# Patient Record
Sex: Female | Born: 1970 | Race: White | Hispanic: No | Marital: Married | Smoking: Former smoker
Health system: Southern US, Community
[De-identification: ages and names within clinical notes are randomized; demographics above are authoritative.]

## PROBLEM LIST (undated history)

## (undated) DIAGNOSIS — I8393 Asymptomatic varicose veins of bilateral lower extremities: Secondary | ICD-10-CM

## (undated) HISTORY — DX: Asymptomatic varicose veins of bilateral lower extremities: I83.93

## (undated) HISTORY — PX: APPENDECTOMY: SHX54

---

## 1998-02-28 ENCOUNTER — Other Ambulatory Visit: Admission: RE | Admit: 1998-02-28 | Discharge: 1998-02-28 | Payer: Self-pay | Admitting: Obstetrics and Gynecology

## 1999-05-13 ENCOUNTER — Emergency Department (HOSPITAL_COMMUNITY): Admission: EM | Admit: 1999-05-13 | Discharge: 1999-05-13 | Payer: Self-pay | Admitting: Emergency Medicine

## 1999-09-05 ENCOUNTER — Inpatient Hospital Stay (HOSPITAL_COMMUNITY): Admission: AD | Admit: 1999-09-05 | Discharge: 1999-09-07 | Payer: Self-pay | Admitting: Obstetrics and Gynecology

## 1999-10-16 ENCOUNTER — Other Ambulatory Visit: Admission: RE | Admit: 1999-10-16 | Discharge: 1999-10-16 | Payer: Self-pay | Admitting: Obstetrics and Gynecology

## 1999-11-09 ENCOUNTER — Other Ambulatory Visit: Admission: RE | Admit: 1999-11-09 | Discharge: 1999-11-09 | Payer: Self-pay | Admitting: Pediatrics

## 2000-01-22 ENCOUNTER — Other Ambulatory Visit: Admission: RE | Admit: 2000-01-22 | Discharge: 2000-01-22 | Payer: Self-pay | Admitting: Physical Therapy

## 2000-06-13 ENCOUNTER — Other Ambulatory Visit: Admission: RE | Admit: 2000-06-13 | Discharge: 2000-06-13 | Payer: Self-pay | Admitting: Obstetrics and Gynecology

## 2000-10-07 ENCOUNTER — Other Ambulatory Visit: Admission: RE | Admit: 2000-10-07 | Discharge: 2000-10-07 | Payer: Self-pay | Admitting: Obstetrics and Gynecology

## 2001-02-13 ENCOUNTER — Other Ambulatory Visit: Admission: RE | Admit: 2001-02-13 | Discharge: 2001-02-13 | Payer: Self-pay | Admitting: Obstetrics and Gynecology

## 2002-01-30 ENCOUNTER — Other Ambulatory Visit: Admission: RE | Admit: 2002-01-30 | Discharge: 2002-01-30 | Payer: Self-pay | Admitting: Obstetrics and Gynecology

## 2003-02-26 ENCOUNTER — Other Ambulatory Visit: Admission: RE | Admit: 2003-02-26 | Discharge: 2003-02-26 | Payer: Self-pay | Admitting: Obstetrics and Gynecology

## 2004-03-29 ENCOUNTER — Other Ambulatory Visit: Admission: RE | Admit: 2004-03-29 | Discharge: 2004-03-29 | Payer: Self-pay | Admitting: Obstetrics and Gynecology

## 2012-07-22 ENCOUNTER — Other Ambulatory Visit (HOSPITAL_COMMUNITY): Payer: Self-pay | Admitting: Obstetrics & Gynecology

## 2012-07-22 DIAGNOSIS — Z1231 Encounter for screening mammogram for malignant neoplasm of breast: Secondary | ICD-10-CM

## 2012-08-07 ENCOUNTER — Ambulatory Visit (HOSPITAL_COMMUNITY)
Admission: RE | Admit: 2012-08-07 | Discharge: 2012-08-07 | Disposition: A | Discharge status: Home / Self Care | Payer: Self-pay | Source: Ambulatory Visit | Attending: Obstetrics & Gynecology | Admitting: Obstetrics & Gynecology

## 2012-08-07 DIAGNOSIS — Z1231 Encounter for screening mammogram for malignant neoplasm of breast: Secondary | ICD-10-CM

## 2017-04-15 ENCOUNTER — Other Ambulatory Visit: Payer: Self-pay | Admitting: Family Medicine

## 2017-04-15 DIAGNOSIS — Z1231 Encounter for screening mammogram for malignant neoplasm of breast: Secondary | ICD-10-CM

## 2017-04-22 ENCOUNTER — Ambulatory Visit
Admission: RE | Admit: 2017-04-22 | Discharge: 2017-04-22 | Disposition: A | Discharge status: Home / Self Care | Payer: BLUE CROSS/BLUE SHIELD | Source: Ambulatory Visit | Attending: Family Medicine | Admitting: Family Medicine

## 2017-04-22 DIAGNOSIS — Z1231 Encounter for screening mammogram for malignant neoplasm of breast: Secondary | ICD-10-CM

## 2018-04-10 ENCOUNTER — Other Ambulatory Visit: Payer: Self-pay | Admitting: Family Medicine

## 2018-04-10 DIAGNOSIS — Z1231 Encounter for screening mammogram for malignant neoplasm of breast: Secondary | ICD-10-CM

## 2018-04-15 ENCOUNTER — Encounter: Payer: Self-pay | Admitting: Vascular Surgery

## 2018-04-15 ENCOUNTER — Other Ambulatory Visit: Payer: Self-pay

## 2018-04-15 ENCOUNTER — Ambulatory Visit: Payer: BLUE CROSS/BLUE SHIELD | Admitting: Vascular Surgery

## 2018-04-15 VITALS — BP 126/85 | HR 98 | Temp 98.5°F | Resp 16 | Ht 64.0 in | Wt 156.0 lb

## 2018-04-15 DIAGNOSIS — I83893 Varicose veins of bilateral lower extremities with other complications: Secondary | ICD-10-CM | POA: Insufficient documentation

## 2018-04-15 DIAGNOSIS — I781 Nevus, non-neoplastic: Secondary | ICD-10-CM | POA: Diagnosis not present

## 2018-04-15 NOTE — Progress Notes (Signed)
  Subjective:     Patient ID: Desiree King, female   DOB: 1971-01-31, 47 y.o.   MRN: [id number]  HPI This 47 year old female was referred by Dr. Shirlean Mylar for evaluation of bilateral varicose veins.  She has no history of DVT thrombophlebitis or stasis ulcers.  She does not wear elastic compression stockings.  She does develop some mild swelling as a day progresses bilaterally.  She has noticed some aching discomfort in the posterior aspect of both legs right more than left.  She has never had treatment for varicose veins or spider veins.  She has noted some prominent veins in the back of her legs right worse than left.  Past Medical History:  Diagnosis Date  . Varicose veins of both lower extremities     Social History   Tobacco Use  . Smoking status: Former Smoker    Last attempt to quit: 12/16/1999    Years since quitting: 18.3  . Smokeless tobacco: Never Used  Substance Use Topics  . Alcohol use: Yes    Alcohol/week: 0.6 - 1.2 oz    Types: 1 - 2 Cans of beer per week    Family History  Problem Relation Age of Onset  . Colon cancer Maternal Aunt     No Known Allergies   Current Outpatient Medications:  .  calcium-vitamin D 250-100 MG-UNIT tablet, Take 1 tablet by mouth daily., Disp: , Rfl:  .  Specialty Vitamins Products (MAGNESIUM, AMINO ACID CHELATE,) 133 MG tablet, Take 1 tablet by mouth daily. 1/2 tab, Disp: , Rfl:   Vitals:   04/15/18 1005  BP: 126/85  Pulse: 98  Resp: 16  Temp: 98.5 F (36.9 C)  TempSrc: Oral  SpO2: 100%  Weight: 156 lb (70.8 kg)  Height:  (1.626 m)    Body mass index is 26.78 kg/m.         Review of Systems Negative for chest pain, dyspnea on exertion, PND, orthopnea, hemoptysis, claudication    Objective:   Physical Exam BP 126/85 (BP Location: Left Arm, Patient Position: Sitting, Cuff Size: Normal)   Pulse 98   Temp 98.5 F (36.9 C) (Oral)   Resp 16   Ht  (1.626 m)   Wt 156 lb (70.8 kg)   SpO2 100%   BMI  26.78 kg/m     Gen.-alert and oriented x3 in no apparent distress HEENT normal for age Lungs no rhonchi or wheezing Cardiovascular regular rhythm no murmurs carotid pulses 3+ palpable no bruits audible Abdomen soft nontender no palpable masses Musculoskeletal free of  major deformities Skin clear -no rashes Neurologic normal Lower extremities 3+ femoral and dorsalis pedis pulses palpable bilaterally with no edema A few small prominent subcutaneous veins and borderline spider veins are noted in the posterior thigh around the popliteal fossa right greater than left.  No reticular or bulging varicosities are noted.  No distal hyperpigmentation or ulceration noted.  Today I performed a bedside SonoSite ultrasound exam which revealed normal-sized great saphenous veins bilaterally with no evidence of reflux       Assessment:     Bilateral prominent subcutaneous and borderline spider veins posterior thigh and calf-right greater than left-asymptomatic    Plan:     Would not recommend any treatment for these minimal prominent subcutaneous veins at this time and would not recommend formal venous ultrasound Return on a as needed basis

## 2018-05-05 ENCOUNTER — Ambulatory Visit
Admission: RE | Admit: 2018-05-05 | Discharge: 2018-05-05 | Disposition: A | Discharge status: Home / Self Care | Payer: BLUE CROSS/BLUE SHIELD | Source: Ambulatory Visit | Attending: Family Medicine | Admitting: Family Medicine

## 2018-05-05 DIAGNOSIS — Z1231 Encounter for screening mammogram for malignant neoplasm of breast: Secondary | ICD-10-CM

## 2018-05-07 ENCOUNTER — Other Ambulatory Visit: Payer: Self-pay | Admitting: Family Medicine

## 2018-05-07 DIAGNOSIS — R928 Other abnormal and inconclusive findings on diagnostic imaging of breast: Secondary | ICD-10-CM

## 2018-05-12 ENCOUNTER — Ambulatory Visit
Admission: RE | Admit: 2018-05-12 | Discharge: 2018-05-12 | Disposition: A | Discharge status: Home / Self Care | Payer: BLUE CROSS/BLUE SHIELD | Source: Ambulatory Visit | Attending: Family Medicine | Admitting: Family Medicine

## 2018-05-12 DIAGNOSIS — R928 Other abnormal and inconclusive findings on diagnostic imaging of breast: Secondary | ICD-10-CM

## 2018-05-23 ENCOUNTER — Encounter (HOSPITAL_COMMUNITY): Payer: Self-pay | Admitting: Emergency Medicine

## 2018-05-23 ENCOUNTER — Emergency Department (HOSPITAL_COMMUNITY): Payer: BLUE CROSS/BLUE SHIELD

## 2018-05-23 ENCOUNTER — Emergency Department (HOSPITAL_COMMUNITY)
Admission: EM | Admit: 2018-05-23 | Discharge: 2018-05-23 | Disposition: A | Discharge status: Home / Self Care | Payer: BLUE CROSS/BLUE SHIELD | Attending: Emergency Medicine | Admitting: Emergency Medicine

## 2018-05-23 ENCOUNTER — Ambulatory Visit: Payer: Self-pay | Admitting: Medical

## 2018-05-23 ENCOUNTER — Other Ambulatory Visit: Payer: Self-pay

## 2018-05-23 ENCOUNTER — Encounter: Payer: Self-pay | Admitting: Medical

## 2018-05-23 VITALS — BP 156/90 | HR 68 | Temp 98.7°F | Resp 16 | Wt 157.2 lb

## 2018-05-23 DIAGNOSIS — R0789 Other chest pain: Secondary | ICD-10-CM | POA: Diagnosis not present

## 2018-05-23 DIAGNOSIS — R079 Chest pain, unspecified: Secondary | ICD-10-CM

## 2018-05-23 DIAGNOSIS — R030 Elevated blood-pressure reading, without diagnosis of hypertension: Secondary | ICD-10-CM

## 2018-05-23 DIAGNOSIS — Z79899 Other long term (current) drug therapy: Secondary | ICD-10-CM | POA: Diagnosis not present

## 2018-05-23 DIAGNOSIS — Z87891 Personal history of nicotine dependence: Secondary | ICD-10-CM | POA: Diagnosis not present

## 2018-05-23 LAB — BASIC METABOLIC PANEL
Anion gap: 8 (ref 5–15)
BUN: 10 mg/dL (ref 6–20)
CALCIUM: 9.6 mg/dL (ref 8.9–10.3)
CO2: 25 mmol/L (ref 22–32)
Chloride: 107 mmol/L (ref 101–111)
Creatinine, Ser: 0.99 mg/dL (ref 0.44–1.00)
GFR calc non Af Amer: >60 mL/min (ref >60)
GLUCOSE: 95 mg/dL (ref 65–99)
POTASSIUM: 3.9 mmol/L (ref 3.5–5.1)
SODIUM: 140 mmol/L (ref 135–145)

## 2018-05-23 LAB — CBC
HEMATOCRIT: 41.5 % (ref 36.0–46.0)
HEMOGLOBIN: 13.4 g/dL (ref 12.0–15.0)
MCH: 30.3 pg (ref 26.0–34.0)
MCHC: 32.3 g/dL (ref 30.0–36.0)
MCV: 93.9 fL (ref 78.0–100.0)
Platelets: 251 10*3/uL (ref 150–400)
RBC: 4.42 MIL/uL (ref 3.87–5.11)
RDW: 12.3 % (ref 11.5–15.5)
WBC: 7.7 10*3/uL (ref 4.0–10.5)

## 2018-05-23 LAB — I-STAT TROPONIN, ED
TROPONIN I, POC: 0 ng/mL (ref 0.00–0.08)
TROPONIN I, POC: 0 ng/mL (ref 0.00–0.08)

## 2018-05-23 LAB — I-STAT BETA HCG BLOOD, ED (MC, WL, AP ONLY)

## 2018-05-23 MED ORDER — ALBUTEROL SULFATE HFA 108 (90 BASE) MCG/ACT IN AERS
2.0000 | INHALATION_SPRAY | Freq: Once | RESPIRATORY_TRACT | Status: AC
  Administered 2018-05-23: 2 via RESPIRATORY_TRACT
  Filled 2018-05-23: qty 6.7

## 2018-05-23 NOTE — Discharge Instructions (Signed)
Your evaluation today is very reassuring and does not suggest an acute problem with your heart or lungs causing your symptoms.  Your cough may be related to postinfectious reactive bronchitis, use albuterol inhaler as needed if this is not helping please follow-up with your regular doctor.  If you develop worsening chest pain, that radiates to the arm neck or jaw or is worse with deep breaths, shortness of breath, you feel lightheaded or like you are going to pass out or any other new concerning symptoms do not hesitate to return to the emergency department for reevaluation.

## 2018-05-23 NOTE — ED Triage Notes (Addendum)
Pt states 2 days of epigastric pressure/indegestion that was constant. Pt states she had some pain relief about an hour ago, currently 1/10. No associated symptoms

## 2018-05-23 NOTE — Progress Notes (Signed)
   Subjective:    Patient ID: Desiree King, female    DOB: 31-Oct-1971, 47 y.o.   MRN: [id number]  HPI 47 yo  One month history of cough.  Started Flonase in November, then got a cold resolved but had clear nasal discharge continue."I will tell why I am really here, I have had  2 days ago felt pressure in chest. Lasting a full 2 days. "It felt like I had indigestion but I never have indigestion" , she recalls nothing she ate to cause it.  Chest tightness and irritable. Had discomfort in neck on the right side she thinks form an exercise and doing jack knives ( flexion of trunk arms touching feet). No pain going down the arm. Cough unproductive but clear. Feeling better today. Taking Zyrtec daily. "I want you to tell me it's okay for me to go to the gym and work out ." Yesterday with chest pain , eating or drinking made it worse. Hot fluids felt as if she was swallowing something large. "I also feel like I have not been feeling right" " felt as if my heart was racing and I felt odd."  "My blood pressure  runs 140/ 90 with my white coat syndrome". Blood work May  7th all was fine patient reports.   Blood pressure (!) 156/90, pulse 68, temperature 98.7 F (37.1 C), temperature source Oral, resp. rate 16, weight 157 lb 3.2 oz (71.3 kg), SpO2 99 %. Family history Dad with high blood pressure Mother high cholesterol  Review of Systems  Constitutional: Negative for activity change, chills, fatigue and fever.  HENT: Positive for congestion and sore throat. Negative for ear pain.   Eyes: Negative for discharge, itching and visual disturbance.  Respiratory: Positive for cough and chest tightness. Negative for shortness of breath and wheezing.   Cardiovascular: Positive for chest pain and palpitations. Negative for leg swelling.  Gastrointestinal: Negative for abdominal pain.  Endocrine: Negative for polydipsia, polyphagia and polyuria.  Genitourinary: Negative for dysuria.  Musculoskeletal:  Positive for back pain ("it feels like the muscles").  Skin: Negative for rash.  Allergic/Immunologic: Positive for environmental allergies.  Neurological: Negative for dizziness, syncope, light-headedness and headaches.  Psychiatric/Behavioral: Negative for behavioral problems, self-injury and suicidal ideas.       Takes a birth contorl pill that she cannot recall name. Objective:   Physical Exam  Constitutional: She is oriented to person, place, and time. She appears well-developed and well-nourished.  HENT:  Head: Normocephalic and atraumatic.  Eyes: Pupils are equal, round, and reactive to light. Conjunctivae and EOM are normal.  Cardiovascular: Normal rate, regular rhythm and normal heart sounds.  Pulmonary/Chest: Effort normal and breath sounds normal.  Neurological: She is alert and oriented to person, place, and time.  Skin: Skin is warm and dry.  Psychiatric: She has a normal mood and affect. Her behavior is normal. Judgment and thought content normal.  Nursing note and vitals reviewed.         Assessment & Plan:  Non specific Chest pain.   Recommended her to be seen by the Emergency Department for EKG and Troponins. She is agreeable to this , she wants to go home and get her mother and then go to the Emergency Department. She declinces transportation to the Emergency Department with an ambulance. She verbalizes understanding my concerns and has no questions at discharge.

## 2018-05-23 NOTE — ED Provider Notes (Signed)
[facility] EMERGENCY DEPARTMENT Provider Note   CSN: [id number] Arrival date & time: 05/23/18  1624     History   Chief Complaint Chief Complaint  Patient presents with  . Chest Pain    HPI Desiree King is a 47 y.o. female.  Desiree King is a 47 y.o. Female who is otherwise healthy, presents to the emergency department for evaluation of 2 days of sensation of chest fullness and tightness.  She reports for the past 2 days she has had this sense of substernal fullness, no radiation of pain, pain is not worse with exertion, pain is not pleuritic in nature.  Symptoms completely resolved upon arrival to the emergency department.  Patient denies any associated shortness of breath or lightheadedness.  She does report after recovering from a viral upper respiratory infection is had persistent dry cough and wonders if this could be contributing.  No syncope.  No abdominal pain, nausea or vomiting.  No diaphoresis. Denies lower extremity pain or swelling, recent travel or immobilization, history of PE or DVT, family or personal history of bleeding or clotting disorders, cough or hemoptysis.      Past Medical History:  Diagnosis Date  . Varicose veins of both lower extremities     Patient Active Problem List   Diagnosis Date Noted  . Varicose veins of bilateral lower extremities with other complications 04/15/2018  . Asymptomatic spider veins of both lower extremities 04/15/2018    Past Surgical History:  Procedure Laterality Date  . APPENDECTOMY       OB History   None      Home Medications    Prior to Admission medications   Medication Sig Start Date End Date Taking? Authorizing Provider  Calcium Carbonate-Vitamin D (CALCIUM-D PO) Take 1 tablet by mouth daily.   Yes [provider]  cetirizine (ZYRTEC) 10 MG tablet Take 10 mg by mouth daily.   Yes [provider]  fluticasone (FLONASE) 50 MCG/ACT nasal spray Place 1 spray into  both nostrils daily.    Yes [provider]  levonorgestrel-ethinyl estradiol (KURVELO) 0.15-30 MG-MCG tablet Take 1 tablet by mouth at bedtime.   Yes [provider]  MAGNESIUM PO Take 0.5 mg by mouth daily.   Yes [provider]  naproxen sodium (ALEVE) 220 MG tablet Take 220 mg by mouth daily as needed (pain/headache).   Yes [provider]    Family History Family History  Problem Relation Age of Onset  . Colon cancer Maternal Aunt     Social History Social History   Tobacco Use  . Smoking status: Former Smoker    Last attempt to quit: 12/16/1999    Years since quitting: 18.4  . Smokeless tobacco: Never Used  Substance Use Topics  . Alcohol use: Yes    Alcohol/week: 0.6 - 1.2 oz    Types: 1 - 2 Cans of beer per week  . Drug use: Never     Allergies   Patient has no known allergies.   Review of Systems Review of Systems  Constitutional: Negative for chills and fever.  HENT: Negative for congestion, rhinorrhea and sore throat.   Eyes: Negative for visual disturbance.  Respiratory: Positive for cough. Negative for shortness of breath and wheezing.   Cardiovascular: Positive for chest pain.  Gastrointestinal: Negative for abdominal pain, nausea and vomiting.  Genitourinary: Negative for dysuria and frequency.  Musculoskeletal: Negative for arthralgias and myalgias.  Skin: Negative for color change and rash.  Neurological: Negative for dizziness, syncope, weakness, light-headedness and numbness.     Physical Exam Updated Vital Signs BP (!) 137/96   Pulse 66   Temp 98.2 F (36.8 C) (Oral)   Resp 11   Ht 5\' 4"  (1.626 m)   Wt 71.2 kg (157 lb)   SpO2 99%   BMI 26.95 kg/m   Physical Exam  Constitutional: She appears well-developed and well-nourished. No distress.  HENT:  Head: Normocephalic and atraumatic.  Mouth/Throat: Oropharynx is clear and moist.  Eyes: Pupils are equal, round, and reactive to light. EOM are normal.  Right eye exhibits no discharge. Left eye exhibits no discharge.  Neck: Neck supple.  Cardiovascular: Normal rate, regular rhythm, normal heart sounds and intact distal pulses.  Pulses:      Radial pulses are 2+ on the right side, and 2+ on the left side.       Dorsalis pedis pulses are 2+ on the right side, and 2+ on the left side.  Pulmonary/Chest: Effort normal and breath sounds normal. No stridor. No respiratory distress. She has no wheezes. She has no rales.  Respirations equal and unlabored, patient able to speak in full sentences, lungs clear to auscultation bilaterally  Abdominal: Soft. Bowel sounds are normal. She exhibits no distension and no mass. There is no tenderness. There is no guarding.  Musculoskeletal: She exhibits no edema or deformity.  Neurological: She is alert. Coordination normal.  Speech is clear, able to follow commands CN III-XII intact Normal strength in upper and lower extremities bilaterally including dorsiflexion and plantar flexion, strong and equal grip strength Sensation normal to light and sharp touch Moves extremities without ataxia, coordination intact  Skin: Skin is warm and dry. Capillary refill takes less than 2 seconds. She is not diaphoretic.  Nursing note and vitals reviewed.    ED Treatments / Results  Labs (all labs ordered are listed, but only abnormal results are displayed) Labs Reviewed  BASIC METABOLIC PANEL  CBC  I-STAT TROPONIN, ED  I-STAT BETA HCG BLOOD, ED ([facility], [facility], [facility] ONLY)  I-STAT TROPONIN, ED    EKG EKG Interpretation  Date/Time:  Friday May 23 2018 16:35:16 EDT Ventricular Rate:  72 PR Interval:  142 QRS Duration: 90 QT Interval:  392 QTC Calculation: 429 R Axis:   87 Text Interpretation:  Normal sinus rhythm with sinus arrhythmia Right atrial enlargement Borderline ECG Confirmed by Rolland Porter ([id number]) on 05/23/2018 10:49:04 PM   Radiology Dg Chest 2 View  Result Date: 05/23/2018 CLINICAL DATA:  Two day history  of fullness in chest- no associated symptoms. No pain presently- no cardiac risk factorsRecent recovery from a cold that seemed to dissipate into seasonal allergies and the pt is worried it had settled into her chest, non productive cough. EXAM: CHEST - 2 VIEW COMPARISON:  None. FINDINGS: The heart size and mediastinal contours are within normal limits. Both lungs are clear. No pleural effusion or pneumothorax. The visualized skeletal structures are unremarkable. IMPRESSION: No active cardiopulmonary disease. Electronically Signed   By: Amie Portland M.D.   On: 05/23/2018 17:26    Procedures Procedures (including critical care time)  Medications Ordered in ED Medications  albuterol (PROVENTIL HFA;VENTOLIN HFA) 108 (90 Base) MCG/ACT inhaler 2 puff (2 puffs Inhalation Given 05/23/18 2227)     Initial Impression / Assessment and Plan / ED Course  I have reviewed the triage vital signs and the nursing notes.  Pertinent labs & imaging results that were available during my care of the  patient were reviewed by me and considered in my medical decision making (see chart for details).  Chest pain is not likely of cardiac or pulmonary etiology d/t presentation, PERC negative, VSS, no tracheal deviation, no JVD or new murmur, RRR, breath sounds equal bilaterally, EKG without acute abnormalities, negative troponin x 2, and negative CXR. Pt has been advised to return to the ED if CP becomes exertional, associated with diaphoresis or nausea, radiates to left jaw/arm, worsens or becomes concerning in any way. Patient is to be discharged with recommendation to follow up with PCP in regards to today's hospital visit. Pt appears reliable for follow up and is agreeable to discharge.    Final Clinical Impressions(s) / ED Diagnoses   Final diagnoses:  Atypical chest pain  Elevated blood pressure, situational    ED Discharge Orders    None       Legrand RamsFord, Idelle Reimann N, PA-C 05/23/18 2259    Rolland PorterJames, Mark,  MD 05/30/18 [phone]

## 2018-05-23 NOTE — ED Provider Notes (Signed)
Patient placed in Quick Look pathway, seen and evaluated   Chief Complaint: chest pain   HPI:   Two day history of fullness in chest- no associated symptoms. No pain presently- no cardiac risk factors   ROS: chest pain (one)  Physical Exam:   Gen: No distress  Neuro: Awake and Alert  Skin: Warm    Focused Exam: heart RRR lungs clear   Initiation of care has begun. The patient has been counseled on the process, plan, and necessity for staying for the completion/evaluation, and the remainder of the medical screening examination    Eyvonne MechanicHedges, Adleigh Mcmasters, Cordelia Poche-C 05/23/18 1705    Margarita Grizzleay, Danielle, MD 05/24/18 [phone]

## 2018-05-23 NOTE — Patient Instructions (Signed)

## 2018-07-01 ENCOUNTER — Ambulatory Visit: Payer: BLUE CROSS/BLUE SHIELD | Admitting: Pulmonary Disease

## 2018-07-01 ENCOUNTER — Encounter: Payer: Self-pay | Admitting: Pulmonary Disease

## 2018-07-01 VITALS — BP 140/88 | HR 121 | Ht 64.0 in | Wt 162.0 lb

## 2018-07-01 DIAGNOSIS — R079 Chest pain, unspecified: Secondary | ICD-10-CM

## 2018-07-01 DIAGNOSIS — R0602 Shortness of breath: Secondary | ICD-10-CM

## 2018-07-01 DIAGNOSIS — R05 Cough: Secondary | ICD-10-CM | POA: Diagnosis not present

## 2018-07-01 DIAGNOSIS — R059 Cough, unspecified: Secondary | ICD-10-CM

## 2018-07-01 NOTE — Consult Note (Signed)
Desiree King    [id number]    [birth year]  Primary Care Physician:Webb, Okey Regal, MD  Referring Physician: Shirlean Mylar, MD [address] [place], [place] [address]  Chief complaint:   Patient with cough and shortness of breath post exertion Has also had chest discomfort, post exertion  HPI:  Post exertion chest pain and discomfort No diagnosis of lung disease She did see her primary care doctor about a month ago with chest discomfort post activity-had some evaluation which were negative  At the onset of symptoms, she had had some upper respiratory congestion which she felt was related to her possible allergies, she does take Zyrtec daily She felt the pressure in her chest was related to indigestion, did have some neck discomfort around the same time Nonproductive cough, She is not limited with activities, able to exercise about an hour at least twice a week, she does feel short of breath during the activity but able to push through Usually has cough and her shortness of breath post activity She has not heard any wheezing Following recent evaluation she was given a sample of albuterol-did not notice that this really helped She has a sibling with exercise-induced asthma Reformed smoker quit in 2004, about 15-pack-year smoking Never diagnosed with asthma no exercise-induced asthma, no history of heart disease.  Occupation: No pertinent occupational history Exposures: No significant exposures Smoking history: Reformed smoker Relevant family history: Sibling with exercise-induced asthma  Outpatient Encounter Medications as of 07/01/2018  Medication Sig  . Calcium Carbonate-Vitamin D (CALCIUM-D PO) Take 1 tablet by mouth daily.  . cetirizine (ZYRTEC) 10 MG tablet Take 10 mg by mouth daily.  . fluticasone (FLONASE) 50 MCG/ACT nasal spray Place 1 spray into both nostrils daily.   Marland Kitchen levonorgestrel-ethinyl estradiol (KURVELO) 0.15-30 MG-MCG tablet Take 1  tablet by mouth at bedtime.  Marland Kitchen MAGNESIUM PO Take 0.5 mg by mouth daily.  . naproxen sodium (ALEVE) 220 MG tablet Take 220 mg by mouth daily as needed (pain/headache).   No facility-administered encounter medications on file as of 07/01/2018.     Allergies as of 07/01/2018  . (No Known Allergies)    Past Medical History:  Diagnosis Date  . Varicose veins of both lower extremities     Past Surgical History:  Procedure Laterality Date  . APPENDECTOMY      Family History  Problem Relation Age of Onset  . Colon cancer Maternal Aunt     Social History   Socioeconomic History  . Marital status: Married    Spouse name: Not on file  . Number of children: 3  . Years of education: Not on file  . Highest education level: Not on file  Occupational History  . Not on file  Social Needs  . Financial resource strain: Not on file  . Food insecurity:    Worry: Not on file    Inability: Not on file  . Transportation needs:    Medical: Not on file    Non-medical: Not on file  Tobacco Use  . Smoking status: Former Smoker    Last attempt to quit: 12/16/1999    Years since quitting: 18.5  . Smokeless tobacco: Never Used  Substance and Sexual Activity  . Alcohol use: Yes    Alcohol/week: 0.6 - 1.2 oz    Types: 1 - 2 Cans of beer per week  . Drug use: Never  . Sexual activity:  Not on file  Lifestyle  . Physical activity:    Days per week: Not on file    Minutes per session: Not on file  . Stress: Not on file  Relationships  . Social connections:    Talks on phone: Not on file    Gets together: Not on file    Attends religious service: Not on file    Active member of club or organization: Not on file    Attends meetings of clubs or organizations: Not on file    Relationship status: Not on file  . Intimate partner violence:    Fear of current or ex partner: Not on file    Emotionally abused: Not on file    Physically abused: Not on file    Forced sexual activity: Not on file   Other Topics Concern  . Not on file  Social History Narrative  . Not on file    Review of systems: Review of Systems  Constitutional: Negative for fever and chills.  HENT: Negative.   Eyes: Negative for blurred vision.  Respiratory: Cough and congestion post activity, chest discomfort Cardiovascular:palpitations post activity  Gastrointestinal: Negative for vomiting, diarrhea, blood per rectum. Genitourinary: Negative for dysuria, urgency, frequency and hematuria.  Musculoskeletal: Negative for myalgias, back pain and joint pain.  Skin: Negative for itching and rash.  Neurological: Negative for dizziness, tremors, focal weakness, seizures and loss of consciousness.  Endo/Heme/Allergies: Negative for environmental allergies.  Psychiatric/Behavioral: Negative for depression, suicidal ideas and hallucinations.  All other systems reviewed and are negative.  Physical Exam: Blood pressure 140/88, pulse (!) 121, height 5\' 4"  (1.626 m), weight 162 lb (73.5 kg), SpO2 97 %. Gen:   Does not appear to be in distress,  HEENT:  EOMI, sclera anicteric Neck:    Supple, no JVD no thyromegaly no adenopathy Lungs:    Good entry bilaterally, clear to auscultation CV:         S1-S2 appreciated with no murmur Abd:      Abdomen is soft, nontender bowel sounds appreciated Ext:    No edema Skin:      Warm and dry Neuro: alert and oriented x 3 Psych: normal mood and affect  Data Reviewed: Chest x-ray 05/23/2018 reviewed by myself shows no acute infiltrate Recent EKG on 05/26/2018 reveals no significant abnormality Assessment:   .  Nonspecific chest pain Will come back discomfort usually occurs post activity, nonlimiting Evaluation so far has been negative  .  Cough Usually occurs post activity as well, usually able to complete exercise regimen She does have a cough, congestion, clears clear mucus Has not recently felt unwell  .  Shortness of breath post activity No prior history of lung  disease, did smoke in the past, was never diagnosed with any lung disease Sibling with exercise-induced asthma She has not heard herself wheezing   Plan/Recommendations: .  Obtain a pulmonary function study with methacholine challenge Possibility of exercise-induced bronchospasm however symptoms are not conclusive for this  Past history of smoking, possible obstructive lung disease  .  Obtain echocardiogram To further evaluate shortness of breath, tachycardia which has been concerning  If no significant findings on the above studies Will follow-up as needed Will advise patient of results of above studies She did try albuterol, did not notice that it really made a difference with her symptoms  Virl DiamondAdewale Shaunna Rosetti MD [facility] 07/01/2018, 9:35 AM  CC: Shirlean MylarWebb, Carol, MD

## 2018-07-01 NOTE — Patient Instructions (Addendum)
.    Cough .  Shortness of breath .  No underlying diagnosed lung disease .  Previous smoker  Obtain a pulmonary function study with methacholine challenge study for shortness of breath   Echocardiogram to assess for any cardiac dysfunction contributing to shortness of breath If symptoms continue to be stable without worsening and above studies are normal, no further investigation suggested  Call if any changes in symptoms  We will update you with test results    Will see as needed

## 2018-07-03 ENCOUNTER — Other Ambulatory Visit: Payer: Self-pay

## 2018-07-03 ENCOUNTER — Ambulatory Visit (HOSPITAL_COMMUNITY): Payer: BLUE CROSS/BLUE SHIELD | Attending: Cardiology

## 2018-07-03 DIAGNOSIS — R0602 Shortness of breath: Secondary | ICD-10-CM | POA: Diagnosis present

## 2018-07-04 ENCOUNTER — Telehealth: Payer: Self-pay | Admitting: Pulmonary Disease

## 2018-07-04 NOTE — Telephone Encounter (Signed)
Called patient unable to reach left message to give us a call back.

## 2018-07-07 NOTE — Telephone Encounter (Signed)
Called and spoke with patient regarding results.  Informed the patient of results and recommendations today. Pt verbalized understanding and denied any questions or concerns at this time.  Nothing further needed.  

## 2018-07-21 ENCOUNTER — Ambulatory Visit (HOSPITAL_COMMUNITY)
Admission: RE | Admit: 2018-07-21 | Discharge: 2018-07-21 | Disposition: A | Discharge status: Home / Self Care | Payer: BLUE CROSS/BLUE SHIELD | Source: Ambulatory Visit | Attending: Pulmonary Disease | Admitting: Pulmonary Disease

## 2018-07-21 DIAGNOSIS — R079 Chest pain, unspecified: Secondary | ICD-10-CM

## 2018-07-21 DIAGNOSIS — R05 Cough: Secondary | ICD-10-CM | POA: Diagnosis not present

## 2018-07-21 DIAGNOSIS — R0602 Shortness of breath: Secondary | ICD-10-CM | POA: Diagnosis not present

## 2018-07-21 DIAGNOSIS — R059 Cough, unspecified: Secondary | ICD-10-CM

## 2018-07-21 LAB — PULMONARY FUNCTION TEST
FEF 25-75 POST: 2.62 L/s
FEF 25-75 PRE: 2.84 L/s
FEF2575-%Change-Post: -7 %
FEF2575-%PRED-POST: 90 %
FEF2575-%PRED-PRE: 98 %
FEV1-%Change-Post: -2 %
FEV1-%PRED-POST: 111 %
FEV1-%Pred-Pre: 114 %
FEV1-PRE: 3.3 L
FEV1-Post: 3.23 L
FEV1FVC-%CHANGE-POST: 0 %
FEV1FVC-%PRED-PRE: 95 %
FEV6-%CHANGE-POST: -3 %
FEV6-%PRED-PRE: 120 %
FEV6-%Pred-Post: 116 %
FEV6-Post: 4.09 L
FEV6-Pre: 4.23 L
FEV6FVC-%Change-Post: 0 %
FEV6FVC-%Pred-Post: 100 %
FEV6FVC-%Pred-Pre: 101 %
FVC-%Change-Post: -2 %
FVC-%PRED-PRE: 117 %
FVC-%Pred-Post: 114 %
FVC-POST: 4.14 L
FVC-PRE: 4.26 L
POST FEV1/FVC RATIO: 78 %
Post FEV6/FVC ratio: 99 %
Pre FEV1/FVC ratio: 78 %
Pre FEV6/FVC Ratio: 99 %

## 2018-07-21 MED ORDER — ALBUTEROL SULFATE (2.5 MG/3ML) 0.083% IN NEBU
2.5000 mg | INHALATION_SOLUTION | Freq: Once | RESPIRATORY_TRACT | Status: AC
  Administered 2018-07-21: 2.5 mg via RESPIRATORY_TRACT

## 2018-07-21 MED ORDER — METHACHOLINE 1 MG/ML NEB SOLN
2.0000 mL | Freq: Once | RESPIRATORY_TRACT | Status: AC
  Administered 2018-07-21: 2 mg via RESPIRATORY_TRACT
  Filled 2018-07-21: qty 2

## 2018-07-21 MED ORDER — METHACHOLINE 0.0625 MG/ML NEB SOLN
2.0000 mL | Freq: Once | RESPIRATORY_TRACT | Status: AC
  Administered 2018-07-21: 0.125 mg via RESPIRATORY_TRACT
  Filled 2018-07-21: qty 2

## 2018-07-21 MED ORDER — METHACHOLINE 0.25 MG/ML NEB SOLN
2.0000 mL | Freq: Once | RESPIRATORY_TRACT | Status: AC
  Administered 2018-07-21: 0.5 mg via RESPIRATORY_TRACT
  Filled 2018-07-21: qty 2

## 2018-07-21 MED ORDER — METHACHOLINE 16 MG/ML NEB SOLN
2.0000 mL | Freq: Once | RESPIRATORY_TRACT | Status: AC
  Administered 2018-07-21: 32 mg via RESPIRATORY_TRACT
  Filled 2018-07-21: qty 2

## 2018-07-21 MED ORDER — METHACHOLINE 4 MG/ML NEB SOLN
2.0000 mL | Freq: Once | RESPIRATORY_TRACT | Status: AC
  Administered 2018-07-21: 8 mg via RESPIRATORY_TRACT
  Filled 2018-07-21: qty 2

## 2018-07-21 MED ORDER — SODIUM CHLORIDE 0.9 % IN NEBU
3.0000 mL | INHALATION_SOLUTION | Freq: Once | RESPIRATORY_TRACT | Status: AC
  Administered 2018-07-21: 3 mL via RESPIRATORY_TRACT
  Filled 2018-07-21: qty 3

## 2018-08-06 NOTE — Progress Notes (Signed)
Pt notified of results- see PN dated 08/04/18

## 2018-11-06 IMAGING — MG DIGITAL SCREENING BILATERAL MAMMOGRAM WITH TOMO AND CAD
8 series · 9 of 24 positions shown · non-contrast
Comparison: Previous exam(s).

CLINICAL DATA: Screening.

EXAM:
DIGITAL SCREENING BILATERAL MAMMOGRAM WITH TOMO AND CAD

[L MLO synth-2D]
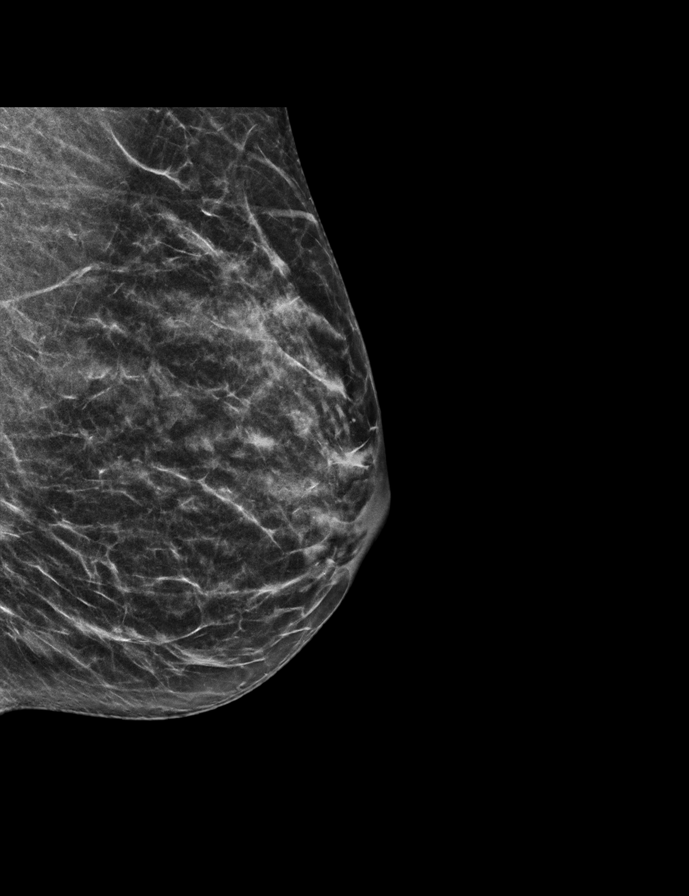

[R CC synth-2D]
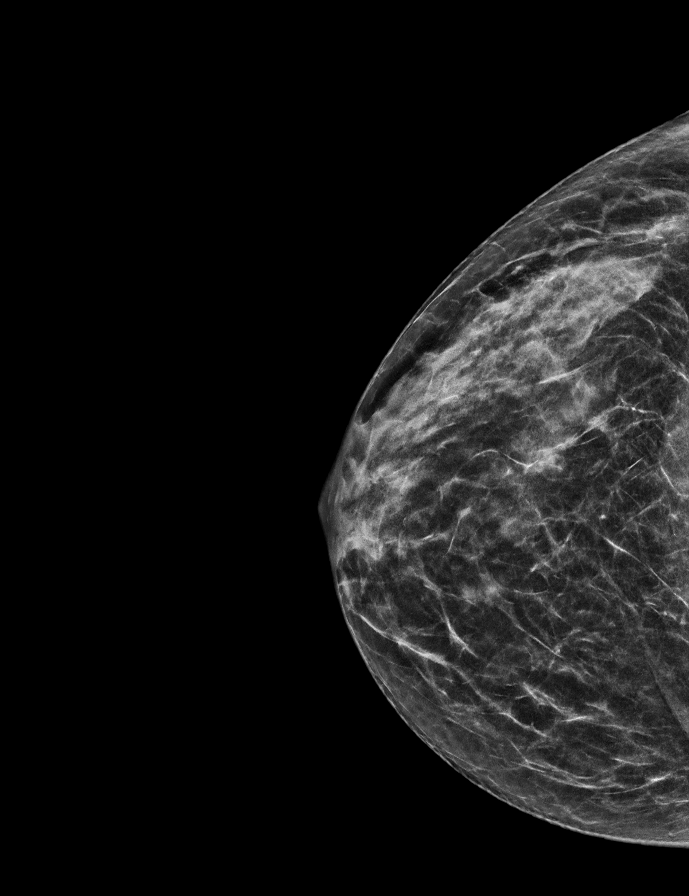

[R MLO synth-2D]
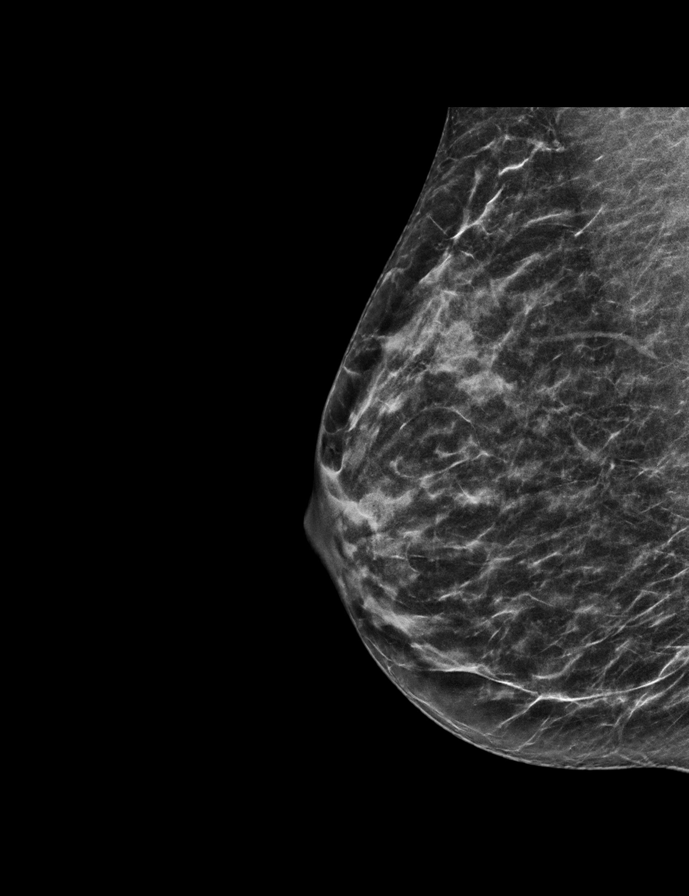

[L CC synth-2D]
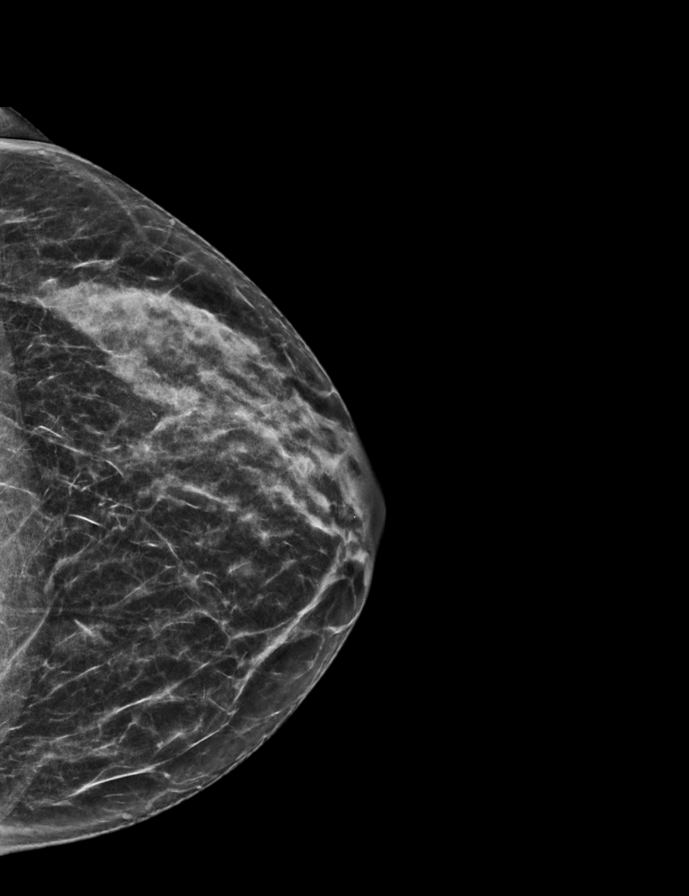

[L MLO tomo · 2 of 51 frames shown]
[frame 17/51]
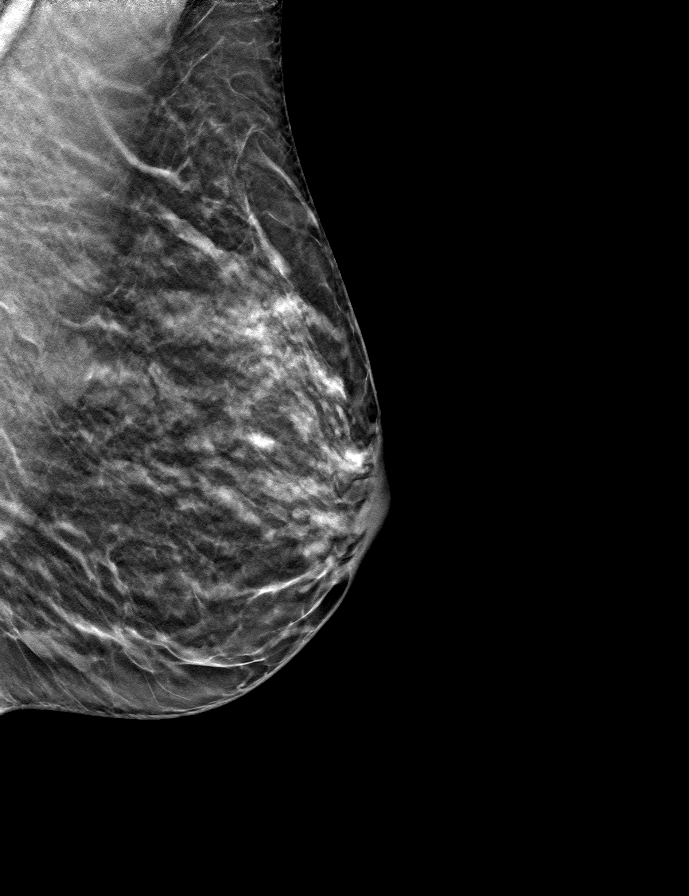
[frame 26/51]
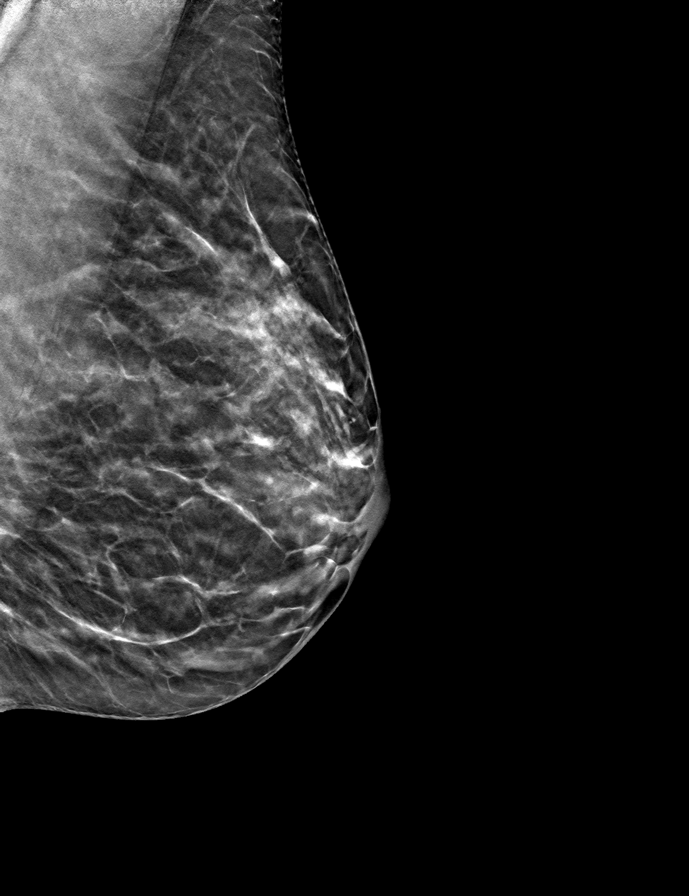

[R CC tomo · tomo slice 26/51.0]
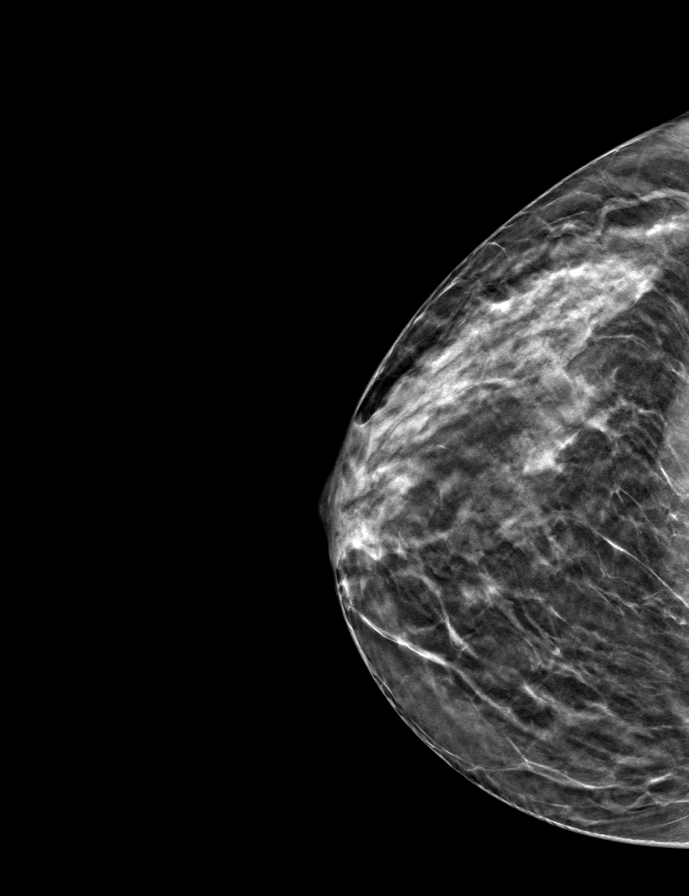

[L CC tomo · tomo slice 27/52.0]
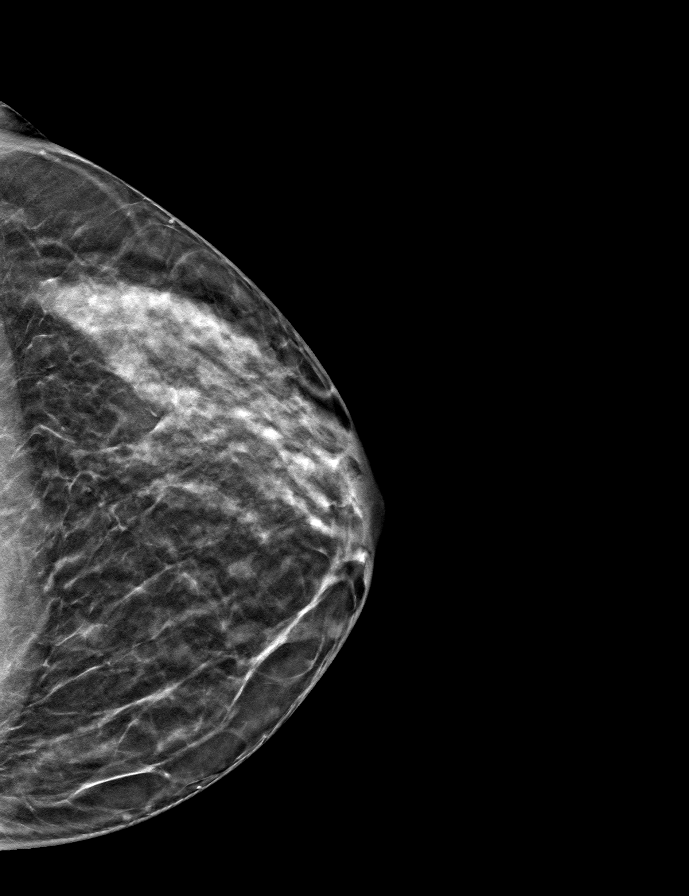

[R MLO tomo · tomo slice 26/51.0]
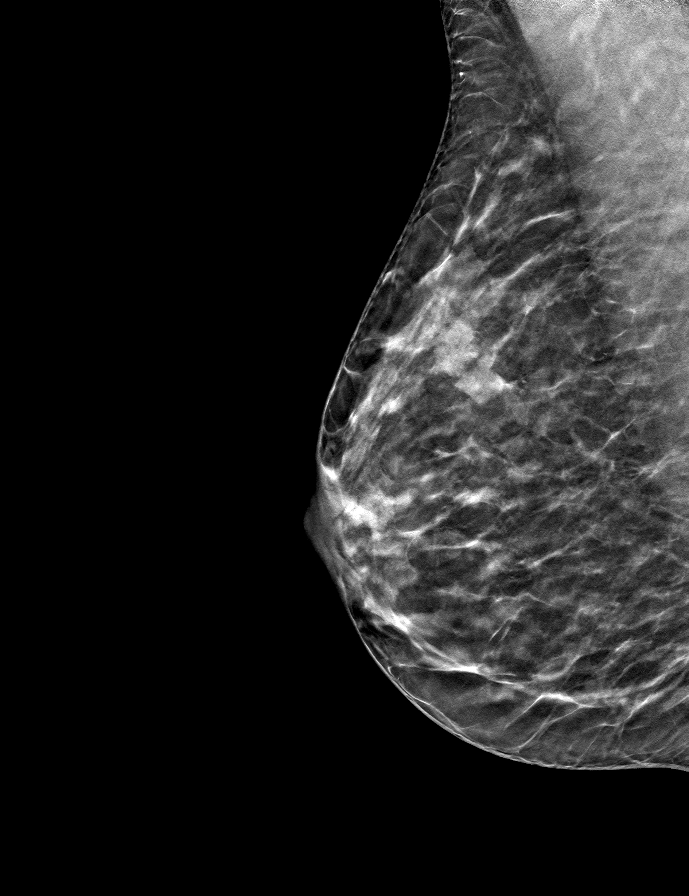

[9 of 24 positions shown; findings below may reference images not displayed]

ACR Breast Density Category c: The breast tissue is heterogeneously
dense, which may obscure small masses.
FINDINGS: In the left breast, a possible asymmetry warrants further
evaluation. In the right breast, no findings suspicious for
malignancy. Images were processed with CAD.
IMPRESSION: Further evaluation is suggested for possible asymmetry in the left
breast.

RECOMMENDATION:
Diagnostic mammogram and possibly ultrasound of the left breast.
(Code:[id number])

The patient will be contacted regarding the findings, and additional
imaging will be scheduled.

BI-RADS CATEGORY  0: Incomplete. Need additional imaging evaluation
and/or prior mammograms for comparison.

## 2019-05-21 ENCOUNTER — Other Ambulatory Visit: Payer: Self-pay | Admitting: Family Medicine

## 2019-05-21 DIAGNOSIS — Z1231 Encounter for screening mammogram for malignant neoplasm of breast: Secondary | ICD-10-CM

## 2019-06-01 ENCOUNTER — Ambulatory Visit: Payer: BLUE CROSS/BLUE SHIELD

## 2019-07-13 ENCOUNTER — Ambulatory Visit: Payer: BLUE CROSS/BLUE SHIELD
